# Patient Record
Sex: Female | Born: 1985 | Race: Black or African American | Hispanic: No | Marital: Single
Health system: Midwestern US, Community
[De-identification: ages and names within clinical notes are randomized; demographics above are authoritative.]

---
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
## 2016-05-10 ENCOUNTER — Inpatient Hospital Stay (HOSPITAL_COMMUNITY)
Admission: AD | Admit: 2016-05-10 | Discharge: 2016-05-10 | Disposition: A | Discharge status: Home / Self Care | Payer: Medicaid Other | Source: Ambulatory Visit | Attending: Obstetrics and Gynecology | Admitting: Obstetrics and Gynecology

## 2016-05-10 ENCOUNTER — Encounter (HOSPITAL_COMMUNITY): Payer: Self-pay | Admitting: *Deleted

## 2016-05-10 DIAGNOSIS — R1030 Lower abdominal pain, unspecified: Secondary | ICD-10-CM | POA: Insufficient documentation

## 2016-05-10 DIAGNOSIS — F17200 Nicotine dependence, unspecified, uncomplicated: Secondary | ICD-10-CM | POA: Insufficient documentation

## 2016-05-10 DIAGNOSIS — M545 Low back pain, unspecified: Secondary | ICD-10-CM

## 2016-05-10 DIAGNOSIS — M549 Dorsalgia, unspecified: Secondary | ICD-10-CM | POA: Diagnosis present

## 2016-05-10 LAB — URINALYSIS, ROUTINE W REFLEX MICROSCOPIC
Bilirubin Urine: NEGATIVE
Glucose, UA: NEGATIVE mg/dL
Ketones, ur: NEGATIVE mg/dL
Leukocytes, UA: NEGATIVE
Nitrite: NEGATIVE
PH: 6 (ref 5.0–8.0)
Protein, ur: NEGATIVE mg/dL
Specific Gravity, Urine: 1.025 (ref 1.005–1.030)

## 2016-05-10 LAB — WET PREP, GENITAL
Sperm: NONE SEEN
Trich, Wet Prep: NONE SEEN
WBC, Wet Prep HPF POC: NONE SEEN
Yeast Wet Prep HPF POC: NONE SEEN

## 2016-05-10 LAB — URINE MICROSCOPIC-ADD ON

## 2016-05-10 LAB — GC/CHLAMYDIA PROBE AMP (~~LOC~~) NOT AT ARMC
Chlamydia: NEGATIVE
NEISSERIA GONORRHEA: NEGATIVE

## 2016-05-10 LAB — POCT PREGNANCY, URINE: Preg Test, Ur: NEGATIVE

## 2016-05-10 MED ORDER — IBUPROFEN 800 MG PO TABS: 800.0000 mg | ORAL_TABLET | Freq: Three times a day (TID) | ORAL | 0 refills | Status: DC | PRN

## 2016-05-10 MED ORDER — IBUPROFEN 800 MG PO TABS
800.0000 mg | ORAL_TABLET | Freq: Once | ORAL | Status: AC
  Administered 2016-05-10: 800 mg via ORAL
  Filled 2016-05-10: qty 1

## 2016-05-10 NOTE — Discharge Instructions (Signed)
Contraception Choices Contraception (birth control) is the use of any methods or devices to prevent pregnancy. Below are some methods to help avoid pregnancy. HORMONAL METHODS   Contraceptive implant. This is a thin, plastic tube containing progesterone hormone. It does not contain estrogen hormone. Your health care provider inserts the tube in the inner part of the upper arm. The tube can remain in place for up to 3 years. After 3 years, the implant must be removed. The implant prevents the ovaries from releasing an egg (ovulation), thickens the cervical mucus to prevent sperm from entering the uterus, and thins the lining of the inside of the uterus.  Progesterone-only injections. These injections are given every 3 months by your health care provider to prevent pregnancy. This synthetic progesterone hormone stops the ovaries from releasing eggs. It also thickens cervical mucus and changes the uterine lining. This makes it harder for sperm to survive in the uterus.  Birth control pills. These pills contain estrogen and progesterone hormone. They work by preventing the ovaries from releasing eggs (ovulation). They also cause the cervical mucus to thicken, preventing the sperm from entering the uterus. Birth control pills are prescribed by a health care provider.Birth control pills can also be used to treat heavy periods.  Minipill. This type of birth control pill contains only the progesterone hormone. They are taken every day of each month and must be prescribed by your health care provider.  Birth control patch. The patch contains hormones similar to those in birth control pills. It must be changed once a week and is prescribed by a health care provider.  Vaginal ring. The ring contains hormones similar to those in birth control pills. It is left in the vagina for 3 weeks, removed for 1 week, and then a new one is put back in place. The patient must be comfortable inserting and removing the ring  from the vagina.A health care provider's prescription is necessary.  Emergency contraception. Emergency contraceptives prevent pregnancy after unprotected sexual intercourse. This pill can be taken right after sex or up to 5 days after unprotected sex. It is most effective the sooner you take the pills after having sexual intercourse. Most emergency contraceptive pills are available without a prescription. Check with your pharmacist. Do not use emergency contraception as your only form of birth control. BARRIER METHODS   Female condom. This is a thin sheath (latex or rubber) that is worn over the penis during sexual intercourse. It can be used with spermicide to increase effectiveness.  Female condom. This is a soft, loose-fitting sheath that is put into the vagina before sexual intercourse.  Diaphragm. This is a soft, latex, dome-shaped barrier that must be fitted by a health care provider. It is inserted into the vagina, along with a spermicidal jelly. It is inserted before intercourse. The diaphragm should be left in the vagina for 6 to 8 hours after intercourse.  Cervical cap. This is a round, soft, latex or plastic cup that fits over the cervix and must be fitted by a health care provider. The cap can be left in place for up to 48 hours after intercourse.  Sponge. This is a soft, circular piece of polyurethane foam. The sponge has spermicide in it. It is inserted into the vagina after wetting it and before sexual intercourse.  Spermicides. These are chemicals that kill or block sperm from entering the cervix and uterus. They come in the form of creams, jellies, suppositories, foam, or tablets. They do not require a  prescription. They are inserted into the vagina with an applicator before having sexual intercourse. The process must be repeated every time you have sexual intercourse. INTRAUTERINE CONTRACEPTION  Intrauterine device (IUD). This is a T-shaped device that is put in a woman's uterus  during a menstrual period to prevent pregnancy. There are 2 types:  Copper IUD. This type of IUD is wrapped in copper wire and is placed inside the uterus. Copper makes the uterus and fallopian tubes produce a fluid that kills sperm. It can stay in place for 10 years.  Hormone IUD. This type of IUD contains the hormone progestin (synthetic progesterone). The hormone thickens the cervical mucus and prevents sperm from entering the uterus, and it also thins the uterine lining to prevent implantation of a fertilized egg. The hormone can weaken or kill the sperm that get into the uterus. It can stay in place for 3-5 years, depending on which type of IUD is used. PERMANENT METHODS OF CONTRACEPTION  Female tubal ligation. This is when the woman's fallopian tubes are surgically sealed, tied, or blocked to prevent the egg from traveling to the uterus.  Hysteroscopic sterilization. This involves placing a small coil or insert into each fallopian tube. Your doctor uses a technique called hysteroscopy to do the procedure. The device causes scar tissue to form. This results in permanent blockage of the fallopian tubes, so the sperm cannot fertilize the egg. It takes about 3 months after the procedure for the tubes to become blocked. You must use another form of birth control for these 3 months.  Female sterilization. This is when the female has the tubes that carry sperm tied off (vasectomy).This blocks sperm from entering the vagina during sexual intercourse. After the procedure, the man can still ejaculate fluid (semen). NATURAL PLANNING METHODS  Natural family planning. This is not having sexual intercourse or using a barrier method (condom, diaphragm, cervical cap) on days the woman could become pregnant.  Calendar method. This is keeping track of the length of each menstrual cycle and identifying when you are fertile.  Ovulation method. This is avoiding sexual intercourse during ovulation.  Symptothermal  method. This is avoiding sexual intercourse during ovulation, using a thermometer and ovulation symptoms.  Post-ovulation method. This is timing sexual intercourse after you have ovulated. Regardless of which type or method of contraception you choose, it is important that you use condoms to protect against the transmission of sexually transmitted infections (STIs). Talk with your health care provider about which form of contraception is most appropriate for you.   This information is not intended to replace advice given to you by your health care provider. Make sure you discuss any questions you have with your health care provider.   Document Released: 07/01/2005 Document Revised: 07/06/2013 Document Reviewed: 12/24/2012 Elsevier Interactive Patient Education 2016 [employer]  Prenatal Care Providers [facility]    [facility]  [phone]     Phone: [phone]          [facility]                      [facility] [place]  @[place]     Phone: [phone]  Phone: [phone]         [facility]     Phone: [phone]  Phone: [phone]           [facility]  [facility] @ [place]                hone: [phone]  Phone: [phone]         [facility]                                             Phone: [phone]           [facility]                Phone: [phone]  [facility]   [address] [place])          Phone: [phone] [facility]   Phone: [phone]

## 2016-05-10 NOTE — MAU Provider Note (Signed)
  History     CSN: [id number]  Arrival date and time: 05/10/16 [id number]   First Provider Initiated Contact with Patient 05/10/16 [phone]      Chief Complaint  Patient presents with  . Back Pain  . Abdominal Pain   Back Pain  This is a new problem. The current episode started 1 to 4 weeks ago. The problem occurs intermittently. The problem is unchanged. The pain is present in the lumbar spine (on the right side only). The pain is at a severity of 10/10. Associated symptoms include abdominal pain. Pertinent negatives include no dysuria or fever. Treatments tried: cranberry pills  The treatment provided no relief.  Abdominal Pain  This is a new problem. The current episode started in the past 7 days. The onset quality is gradual. The problem occurs intermittently. The problem has been unchanged. The pain is located in the suprapubic region. The pain is at a severity of 5/10. The abdominal pain does not radiate. Associated symptoms include constipation (last BM in about 2 days ) and nausea. Pertinent negatives include no diarrhea, dysuria, fever, frequency or vomiting. Nothing aggravates the pain. The pain is relieved by nothing. She has tried nothing for the symptoms.    No past medical history on file.  No past surgical history on file.  No family history on file.  Social History  Substance Use Topics  . Smoking status: Current Every Day Smoker  . Smokeless tobacco: Never Used     Comment: black and mild once a day  . Alcohol use No    Allergies: Allergies not on file  No prescriptions prior to admission.    Review of Systems  Constitutional: Negative for chills and fever.  Gastrointestinal: Positive for abdominal pain, constipation (last BM in about 2 days ) and nausea. Negative for diarrhea and vomiting.  Genitourinary: Negative for dysuria, frequency and urgency.  Musculoskeletal: Positive for back pain.   Physical Exam   Blood pressure 110/64, pulse 61, temperature 97.9 F  (36.6 C), temperature source Oral, resp. rate 18, height 5\' 2"  (1.575 m), weight 179 lb 4 oz (81.3 kg), last menstrual period 04/23/2016.  Physical Exam  MAU Course  Procedures  MDM Patient has had ibuprofen. She reports some improvement in her pain.   Assessment and Plan   1. Lower abdominal pain   2. Acute left-sided low back pain without sciatica    DC home Comfort measures reviewed  RX: ibuprofen 800mg  q 8 hours #30  Return to MAU as needed FU with OB as planned  Follow-up Information    [facility] .   Contact information: [address] [place] [address] [phone]            Tawnya CrookHogan, Kenndra Morris Donovan 05/10/2016, 7:27 AM

## 2016-05-10 NOTE — MAU Note (Signed)
PT  SAYS    SHE FEELS  PAIN  ON LEFT   SIDE  OF  BACK- STARTED     2 MTHS  AGO- STILL FEELS  SAME-   NO MED -   TOOK   CRANBERRY  PILLS.       SAYS  UPPER ABD  STARTED  HURTING   1 MTH  AGO.      SAYS LOWER  ABD  STARTED HURTING   2 WEEKS  AGO.      HPT   NEG.       HAD     IUD    IN - BUT  HAD REMOVED  IN [place]- IN JAN.    - NO  OTHER  BC.     LAST SEX-     SEPT.      SAME   PARTNER - X1 YEAR.     NO BURNING WITH VOIDING- BUT DOES HAVE INCREASE FREQ.     NO VOMITING/ DIARRHEA   .

## 2016-05-10 NOTE — MAU Note (Signed)
Notified Alyssa ShellerHeather Hogan CNM upper and lower abdominal pain, negative UPT, urinary frequency UA sent.

## 2016-12-04 LAB — GLUCOSE, POCT (MANUAL RESULT ENTRY): POC GLUCOSE: 115 mg/dL — AB (ref 70–99)

## 2018-07-10 ENCOUNTER — Inpatient Hospital Stay (HOSPITAL_COMMUNITY)
Admission: AD | Admit: 2018-07-10 | Discharge: 2018-07-10 | Disposition: A | Discharge status: Home / Self Care | Payer: Medicaid Other | Attending: Obstetrics & Gynecology | Admitting: Obstetrics & Gynecology

## 2018-07-10 ENCOUNTER — Encounter (HOSPITAL_COMMUNITY): Payer: Self-pay | Admitting: *Deleted

## 2018-07-10 DIAGNOSIS — R109 Unspecified abdominal pain: Secondary | ICD-10-CM | POA: Diagnosis present

## 2018-07-10 DIAGNOSIS — K219 Gastro-esophageal reflux disease without esophagitis: Secondary | ICD-10-CM | POA: Insufficient documentation

## 2018-07-10 DIAGNOSIS — K529 Noninfective gastroenteritis and colitis, unspecified: Secondary | ICD-10-CM | POA: Insufficient documentation

## 2018-07-10 DIAGNOSIS — F17200 Nicotine dependence, unspecified, uncomplicated: Secondary | ICD-10-CM | POA: Insufficient documentation

## 2018-07-10 DIAGNOSIS — K5900 Constipation, unspecified: Secondary | ICD-10-CM

## 2018-07-10 DIAGNOSIS — Z3202 Encounter for pregnancy test, result negative: Secondary | ICD-10-CM | POA: Insufficient documentation

## 2018-07-10 LAB — COMPREHENSIVE METABOLIC PANEL
ALT: 20 U/L (ref 0–44)
AST: 20 U/L (ref 15–41)
Albumin: 3.8 g/dL (ref 3.5–5.0)
Alkaline Phosphatase: 34 U/L — ABNORMAL LOW (ref 38–126)
Anion gap: 7 (ref 5–15)
BUN: 8 mg/dL (ref 6–20)
CO2: 26 mmol/L (ref 22–32)
Calcium: 8.6 mg/dL — ABNORMAL LOW (ref 8.9–10.3)
Chloride: 105 mmol/L (ref 98–111)
Creatinine, Ser: 0.65 mg/dL (ref 0.44–1.00)
GFR calc Af Amer: >60 mL/min (ref >60)
GFR calc non Af Amer: >60 mL/min (ref >60)
Glucose, Bld: 87 mg/dL (ref 70–99)
Potassium: 3.8 mmol/L (ref 3.5–5.1)
Sodium: 138 mmol/L (ref 135–145)
Total Bilirubin: 0.6 mg/dL (ref 0.3–1.2)
Total Protein: 7.5 g/dL (ref 6.5–8.1)

## 2018-07-10 LAB — CBC
HCT: 39.4 % (ref 36.0–46.0)
Hemoglobin: 13.4 g/dL (ref 12.0–15.0)
MCH: 31.5 pg (ref 26.0–34.0)
MCHC: 34 g/dL (ref 30.0–36.0)
MCV: 92.5 fL (ref 80.0–100.0)
Platelets: 235 10*3/uL (ref 150–400)
RBC: 4.26 MIL/uL (ref 3.87–5.11)
RDW: 12.5 % (ref 11.5–15.5)
WBC: 6.4 10*3/uL (ref 4.0–10.5)
nRBC: 0 % (ref 0.0–0.2)

## 2018-07-10 LAB — URINALYSIS, ROUTINE W REFLEX MICROSCOPIC
Bilirubin Urine: NEGATIVE
Glucose, UA: NEGATIVE mg/dL
Ketones, ur: NEGATIVE mg/dL
Leukocytes, UA: NEGATIVE
Nitrite: NEGATIVE
Protein, ur: NEGATIVE mg/dL
Specific Gravity, Urine: 1.016 (ref 1.005–1.030)
pH: 6 (ref 5.0–8.0)

## 2018-07-10 LAB — POCT PREGNANCY, URINE: Preg Test, Ur: NEGATIVE

## 2018-07-10 LAB — LIPASE, BLOOD: Lipase: 26 U/L (ref 11–51)

## 2018-07-10 MED ORDER — PANTOPRAZOLE SODIUM 40 MG PO TBEC: 40.0000 mg | DELAYED_RELEASE_TABLET | Freq: Every day | ORAL | 0 refills | Status: AC

## 2018-07-10 NOTE — MAU Provider Note (Signed)
Chief Complaint: Abdominal Pain   First Provider Initiated Contact with Patient 07/10/18 0914      SUBJECTIVE HPI: Alyssa Shelton is a 32 y.o. G2P1011 at Unknown by LMP who presents to maternity admissions reporting pain in her mid abdomen whenever she has not eaten and feeling flutters in her LLQ and above her umbilicus. These symptoms started 1 week ago.  She reports the pain is severe when she is hungry but improves after eating.  There is no n/v or diarrhea. She reports bowel movements 1-2 times weekly which is usual for her. Last BM 1 week ago.   She denies any risks for STDs. Her symptoms are associated with some right flank pain that is mild and intermittent. She reports she started drinking cranberry juice and taking AZO cranberry 2 days ago and the discomfort worsened.  She denies risks for STDs.  There are no other symptoms. She has not tried any other treatments. She took a pregnancy test at home that was negative.     HPI  History reviewed. No pertinent past medical history. History reviewed. No pertinent surgical history. Social History   Socioeconomic History  . Marital status: Single    Spouse name: Not on file  . Number of children: Not on file  . Years of education: Not on file  . Highest education level: Not on file  Occupational History  . Not on file  Social Needs  . Financial resource strain: Not on file  . Food insecurity:    Worry: Not on file    Inability: Not on file  . Transportation needs:    Medical: Not on file    Non-medical: Not on file  Tobacco Use  . Smoking status: Current Every Day Smoker  . Smokeless tobacco: Never Used  . Tobacco comment: black and mild once a day  Substance and Sexual Activity  . Alcohol use: No  . Drug use: Yes    Types: Marijuana  . Sexual activity: Yes    Birth control/protection: None  Lifestyle  . Physical activity:    Days per week: Not on file    Minutes per session: Not on file  . Stress: Not on file   Relationships  . Social connections:    Talks on phone: Not on file    Gets together: Not on file    Attends religious service: Not on file    Active member of club or organization: Not on file    Attends meetings of clubs or organizations: Not on file    Relationship status: Not on file  . Intimate partner violence:    Fear of current or ex partner: Not on file    Emotionally abused: Not on file    Physically abused: Not on file    Forced sexual activity: Not on file  Other Topics Concern  . Not on file  Social History Narrative  . Not on file   No current facility-administered medications on file prior to encounter.    Current Outpatient Medications on File Prior to Encounter  Medication Sig Dispense Refill  . ibuprofen (ADVIL,MOTRIN) 800 MG tablet Take 1 tablet (800 mg total) by mouth every 8 (eight) hours as needed. 30 tablet 0   No Known Allergies  ROS:  Review of Systems  Constitutional: Negative for chills, fatigue and fever.  Respiratory: Negative for shortness of breath.   Cardiovascular: Negative for chest pain.  Gastrointestinal: Positive for abdominal pain and constipation. Negative for diarrhea, nausea and vomiting.  Genitourinary: Positive for flank pain. Negative for difficulty urinating, dysuria, pelvic pain, vaginal bleeding, vaginal discharge and vaginal pain.  Neurological: Negative for dizziness and headaches.  Psychiatric/Behavioral: Negative.      I have reviewed patient's Past Medical Hx, Surgical Hx, Family Hx, Social Hx, medications and allergies.   Physical Exam   Patient Vitals for the past 24 hrs:  BP Temp Temp src Pulse Resp SpO2 Height Weight  07/10/18 1108 97/69 - - 63 16 - - -  07/10/18 0858 140/77 98 F (36.7 C) Oral 73 16 100 % 5\' 3"  (1.6 m) 82.1 kg   Constitutional: Well-developed, well-nourished female in no acute distress.  Cardiovascular: normal rate Respiratory: normal effort GI: Abd soft, non-tender. Pos BS x 4 MS:  Extremities nontender, no edema, normal ROM Neurologic: Alert and oriented x 4.  GU: Neg CVAT.  PELVIC EXAM:Deferred   LAB RESULTS Results for orders placed or performed during the hospital encounter of 07/10/18 (from the past 24 hour(s))  Urinalysis, Routine w reflex microscopic     Status: Abnormal   Collection Time: 07/10/18  9:03 AM  Result Value Ref Range   Color, Urine YELLOW YELLOW   APPearance CLOUDY (A) CLEAR   Specific Gravity, Urine 1.016 1.005 - 1.030   pH 6.0 5.0 - 8.0   Glucose, UA NEGATIVE NEGATIVE mg/dL   Hgb urine dipstick SMALL (A) NEGATIVE   Bilirubin Urine NEGATIVE NEGATIVE   Ketones, ur NEGATIVE NEGATIVE mg/dL   Protein, ur NEGATIVE NEGATIVE mg/dL   Nitrite NEGATIVE NEGATIVE   Leukocytes, UA NEGATIVE NEGATIVE   RBC / HPF 6-10 0 - 5 RBC/hpf   WBC, UA 6-10 0 - 5 WBC/hpf   Bacteria, UA RARE (A) NONE SEEN   Squamous Epithelial / LPF 21-50 0 - 5   Mucus PRESENT   Pregnancy, urine POC     Status: None   Collection Time: 07/10/18  9:10 AM  Result Value Ref Range   Preg Test, Ur NEGATIVE NEGATIVE  CBC     Status: None   Collection Time: 07/10/18 10:04 AM  Result Value Ref Range   WBC 6.4 4.0 - 10.5 K/uL   RBC 4.26 3.87 - 5.11 MIL/uL   Hemoglobin 13.4 12.0 - 15.0 g/dL   HCT 16.139.4 09.636.0 - 04.546.0 %   MCV 92.5 80.0 - 100.0 fL   MCH 31.5 26.0 - 34.0 pg   MCHC 34.0 30.0 - 36.0 g/dL   RDW 40.912.5 81.111.5 - 91.415.5 %   Platelets 235 150 - 400 K/uL   nRBC 0.0 0.0 - 0.2 %  Comprehensive metabolic panel     Status: Abnormal   Collection Time: 07/10/18 10:04 AM  Result Value Ref Range   Sodium 138 135 - 145 mmol/L   Potassium 3.8 3.5 - 5.1 mmol/L   Chloride 105 98 - 111 mmol/L   CO2 26 22 - 32 mmol/L   Glucose, Bld 87 70 - 99 mg/dL   BUN 8 6 - 20 mg/dL   Creatinine, Ser 7.820.65 0.44 - 1.00 mg/dL   Calcium 8.6 (L) 8.9 - 10.3 mg/dL   Total Protein 7.5 6.5 - 8.1 g/dL   Albumin 3.8 3.5 - 5.0 g/dL   AST 20 15 - 41 U/L   ALT 20 0 - 44 U/L   Alkaline Phosphatase 34 (L) 38 - 126  U/L   Total Bilirubin 0.6 0.3 - 1.2 mg/dL   GFR calc non Af Amer >60 >60 mL/min   GFR calc Af Amer >60 >  60 mL/min   Anion gap 7 5 - 15  Lipase, blood     Status: None   Collection Time: 07/10/18 10:04 AM  Result Value Ref Range   Lipase 26 11 - 51 U/L       IMAGING No results found.  MAU Management/MDM: Ordered labs and reviewed results. CBC, CMP, lipase all wnl.  No acute abdomen on exam.  No CVA tenderness.  UA with some WBCs, and small Hgb, otherwise normal. Sent for culture.  Pain before eating may be gastroenteritis/GERD, symptoms also c/w ulcer or pre-ulcer.  Will treat with PPI daily x 2 weeks. Pt reports bowel movement weekly is her usual but she is drinking soda and not much water and not eating well.  Reviewed high fiber diet and increased water intake with pt who states understanding.  Pt to f/u with primary care.   Pt discharged with strict return precautions.  ASSESSMENT 1. Gastroenteritis   2. Constipation, unspecified constipation type   3. GERD without esophagitis     PLAN Discharge home Allergies as of 07/10/2018   No Known Allergies     Medication List    STOP taking these medications   ibuprofen 800 MG tablet Commonly known as:  ADVIL,MOTRIN     TAKE these medications   pantoprazole 40 MG tablet Commonly known as:  PROTONIX Take 1 tablet (40 mg total) by mouth daily for 14 days.      Follow-up Information    [facility] Follow up.   Why:  Or Primary Care Provider of your choice, see list provided. Contact information: [address] [place] [phone]       [facility].   Specialty:  Obstetrics and Gynecology Why:  Walk in options for Gyn care 4-8 pm Monday-Thursday or by appointment M-F 8am-5pm. Contact information: [address] [place] [id number] [phone]          Sharen CounterLisa Leftwich-Kirby Certified  Nurse-Midwife 07/10/2018  11:30 AM

## 2018-07-10 NOTE — Discharge Instructions (Signed)
[facility] Resource Guide (Revised August 2014)   No Primary Care Doctor:  To locate a primary care doctor that accepts your insurance or provides certain services:           [facility]: [phone]           Physician Referral Service: [phone] ask for My [facility]  If no insurance, you need to see if you qualify for [facility] orange card, call to set      up appointment for eligibility/enrollment at [phone] or [phone] or visit [facility] and [employer] ([address], [place] and [address]) to meet with a [facility] enrollment specialist.  Agencies that provide inexpensive (sliding fee scale) medical care:       [facility] - [facility] [place] - [phone]     [facility]  -  [facility] [phone]     [facility] - [phone]     [facility] - [phone]     [facility] [phone]- [phone]     [facility] [phone]- [phone]  [facility] - [facility] @ [place] [phone]- [phone]  [facility] - [facility] @ [place] - [phone]  [facility]: [phone]   [facility]: [phone]   [facility]: [phone]   [facility]- 336- [phone]    Medicaid-accepting [facility] Providers:           [facility] [phone] (No Family Planning accepted)          [address], [phone], Mon-Fri 9am-5pm          [facility] - [phone]  [address], [address] [place], Mon-Thursday 8am-5pm, Fri 8am-noon  [facility] - [phone]          [address], [address], Mon-Fri 7:30am-4:30pm          [facility] - [phone]          [address],  [place]Fri 8am-5pm          [facility] - [phone]          [address], [address]          Only accepts [place] [employer] Medicaid patients after they have their name applied to their card

## 2018-07-10 NOTE — MAU Note (Signed)
Pt reports mid abd and right side pain for 2 weeks, not hurting now but was hurting last pm.

## 2018-07-11 LAB — URINE CULTURE: Culture: >=100000 — AB

## 2018-07-31 ENCOUNTER — Ambulatory Visit (HOSPITAL_COMMUNITY)
Admission: EM | Admit: 2018-07-31 | Discharge: 2018-07-31 | Disposition: A | Discharge status: Home / Self Care | Payer: Medicaid Other | Attending: Internal Medicine | Admitting: Internal Medicine

## 2018-07-31 ENCOUNTER — Encounter (HOSPITAL_COMMUNITY): Payer: Self-pay | Admitting: Emergency Medicine

## 2018-07-31 ENCOUNTER — Ambulatory Visit (INDEPENDENT_AMBULATORY_CARE_PROVIDER_SITE_OTHER): Payer: Medicaid Other

## 2018-07-31 DIAGNOSIS — R1012 Left upper quadrant pain: Secondary | ICD-10-CM | POA: Diagnosis not present

## 2018-07-31 DIAGNOSIS — Z3202 Encounter for pregnancy test, result negative: Secondary | ICD-10-CM

## 2018-07-31 DIAGNOSIS — K5909 Other constipation: Secondary | ICD-10-CM | POA: Diagnosis not present

## 2018-07-31 DIAGNOSIS — K2900 Acute gastritis without bleeding: Secondary | ICD-10-CM | POA: Insufficient documentation

## 2018-07-31 DIAGNOSIS — R110 Nausea: Secondary | ICD-10-CM | POA: Diagnosis not present

## 2018-07-31 DIAGNOSIS — N898 Other specified noninflammatory disorders of vagina: Secondary | ICD-10-CM

## 2018-07-31 LAB — POCT URINALYSIS DIP (DEVICE)
Bilirubin Urine: NEGATIVE
GLUCOSE, UA: NEGATIVE mg/dL
Ketones, ur: NEGATIVE mg/dL
LEUKOCYTES UA: NEGATIVE
NITRITE: NEGATIVE
PROTEIN: NEGATIVE mg/dL
SPECIFIC GRAVITY, URINE: 1.015 (ref 1.005–1.030)
UROBILINOGEN UA: 1 mg/dL (ref 0.0–1.0)
pH: 6.5 (ref 5.0–8.0)

## 2018-07-31 LAB — POCT PREGNANCY, URINE: PREG TEST UR: NEGATIVE

## 2018-07-31 NOTE — ED Provider Notes (Signed)
[facility]    CSN: [id number] Arrival date & time: 07/31/18  0840     History   Chief Complaint Chief Complaint  Patient presents with  . Abdominal Pain    HPI Alyssa Shelton is a 33 y.o. female.   Who present with abdominal pain since end of December. She was seen  At [facility] and had a work labs and UA which were normal, and was diagnosed with GI and GERd and was prescribed pantoprazole but has not picked it up. Her reason for not picking it up, is she doubted the diagnosis she was given, since she did not have an abdominal exam.  LMP 2 days ago.  Epigastric pain and LUQ pain with almost like hunger pain, but pressure. Admits of history of constipation. Last BM > 2 days, but not 1 week. Some foods provoke heart burn. Greasy foods are hard to digest specially if she eats pork.      History reviewed. No pertinent past medical history.  There are no active problems to display for this patient.   History reviewed. No pertinent surgical history.  OB History    Gravida  2   Para  1   Term  1   Preterm      AB  1   Living  1     SAB      TAB  1   Ectopic      Multiple      Live Births  1            Home Medications    Prior to Admission medications   Medication Sig Start Date End Date Taking? Authorizing Provider  pantoprazole (PROTONIX) 40 MG tablet Take 1 tablet (40 mg total) by mouth daily for 14 days. 07/10/18 07/24/18  Leftwich-Kirby, Wilmer Floor, CNM    Family History No family history on file.  Social History Social History   Tobacco Use  . Smoking status: Current Every Day Smoker  . Smokeless tobacco: Never Used  . Tobacco comment: black and mild once a day  Substance Use Topics  . Alcohol use: No  . Drug use: Yes    Types: Marijuana     Allergies   Patient has no known allergies.   Review of Systems Review of Systems  Constitutional: Negative for chills, diaphoresis and fever.  Gastrointestinal: Positive for abdominal  pain, constipation and nausea. Negative for blood in stool, diarrhea and vomiting.       Denies belching. No melena  Endocrine: Negative for polyuria.  Genitourinary: Positive for vaginal discharge. Negative for difficulty urinating, dysuria and urgency.       Has light cream color discharge, with no odor or itching.   Skin: Negative for rash.     Physical Exam Triage Vital Signs ED Triage Vitals  Enc Vitals Group     BP 07/31/18 1020 (!) 110/57     Pulse Rate 07/31/18 1020 (!) 56     Resp 07/31/18 1020 16     Temp 07/31/18 1020 98.1 F (36.7 C)     Temp src --      SpO2 07/31/18 1020 100 %     Weight --      Height --      Head Circumference --      Peak Flow --      Pain Score 07/31/18 1025 0     Pain Loc --      Pain Edu? --  Excl. in GC? --    No data found.  Updated Vital Signs BP (!) 110/57   Pulse (!) 56   Temp 98.1 F (36.7 C)   Resp 16   LMP 07/29/2018 (Exact Date)   SpO2 100%   Visual Acuity Right Eye Distance:   Left Eye Distance:   Bilateral Distance:    Right Eye Near:   Left Eye Near:    Bilateral Near:     Physical Exam Vitals signs and nursing note reviewed.  Constitutional:      General: She is not in acute distress.    Appearance: She is well-developed. She is obese. She is not toxic-appearing.  HENT:     Head: Normocephalic.  Eyes:     General: No scleral icterus. Pulmonary:     Effort: Pulmonary effort is normal.  Abdominal:     General: Bowel sounds are normal. There is no distension.     Palpations: There is no hepatomegaly, splenomegaly or mass.     Tenderness: There is abdominal tenderness in the epigastric area and left upper quadrant. There is no guarding or rebound.     Comments: Has more discomfort on LUQ, and very mid on epigastric region  Skin:    General: Skin is warm and dry.     Findings: No rash.  Neurological:     Mental Status: She is alert and oriented to person, place, and time.  Psychiatric:        Mood  and Affect: Mood normal.        Behavior: Behavior normal.    UC Treatments / Results  Labs (all labs ordered are listed, but only abnormal results are displayed) Labs Reviewed  POCT URINALYSIS DIP (DEVICE) - Abnormal; Notable for the following components:      Result Value   Hgb urine dipstick SMALL (*)    All other components within normal limits  POCT PREGNANCY, URINE  UPT- neg  EKG None  Radiology Dg Abd 1 View  Result Date: 07/31/2018 CLINICAL DATA:  Upper abdominal pain LEFT greater than RIGHT, some nausea but no vomiting, history constipation, smoker EXAM: ABDOMEN - 1 VIEW COMPARISON:  None FINDINGS: Normal bowel gas pattern. Scattered normal stool burden. No bowel dilatation or bowel wall thickening. Osseous structures unremarkable. No urinary tract calcification. IMPRESSION: Normal exam. Electronically Signed   By: Ulyses SouthwardMark  Boles M.D.   On: 07/31/2018 12:14    Procedures Procedures (including critical care time)  Medications Ordered in UC Medications - No data to display  Initial Impression / Assessment and Plan / UC Course  I have reviewed the triage vital signs and the nursing notes. Pertinent labs & imaging results that were available during my care of the patient were reviewed by me and considered in my medical decision making (see chart for details). She may have some of the pressure on the LUQ from increased gas and some stool which I saw on her KUB before the final read came back. So I advised her to get citric mag and if epigastric discomfort does not  Improve, then needs to get on the PPI her GYN prescribed to her. Needs to Fu with her PCP if not better.   Final Clinical Impressions(s) / UC Diagnoses   Final diagnoses:  Other acute gastritis without hemorrhage  Other constipation     Discharge Instructions     Get Magnesium Citrate and drink it all to help clean up your colon, then drink 4-8 oz of prune juice  a day to prevent constipation, or take colace  100 mg twice day to prevent constipation.   If the upper abd pain is not gone after emptying your colon, then pick up the prescription for your stomach.     ED Prescriptions    None     Controlled Substance Prescriptions [place] Controlled Substance Registry consulted?    Garey HamRodriguez-Southworth, Rosabella Edgin, PA-C 07/31/18 1221

## 2018-07-31 NOTE — Discharge Instructions (Addendum)
Get Magnesium Citrate and drink it all to help clean up your colon, then drink 4-8 oz of prune juice a day to prevent constipation, or take colace 100 mg twice day to prevent constipation.   If the upper abd pain is not gone after emptying your colon, then pick up the prescription for your stomach.

## 2018-07-31 NOTE — ED Triage Notes (Signed)
Pt c/o generealized abdominal pain for several months, was seen at [facility] at [facility] end of December for the same thing, pt states shes unsure what they dx her with. Note from [facility] says GERD and gastroenteritis, they did labs and urine tests. Was prescribed pantoprazole and states she hasn't picked it up.

## 2019-12-25 IMAGING — DX DG ABDOMEN 1V
1 series · 1 of 1 positions shown · non-contrast
Comparison: None

CLINICAL DATA: Upper abdominal pain LEFT greater than RIGHT, some
nausea but no vomiting, history constipation, smoker

EXAM:
ABDOMEN - 1 VIEW

[abdomen kub]
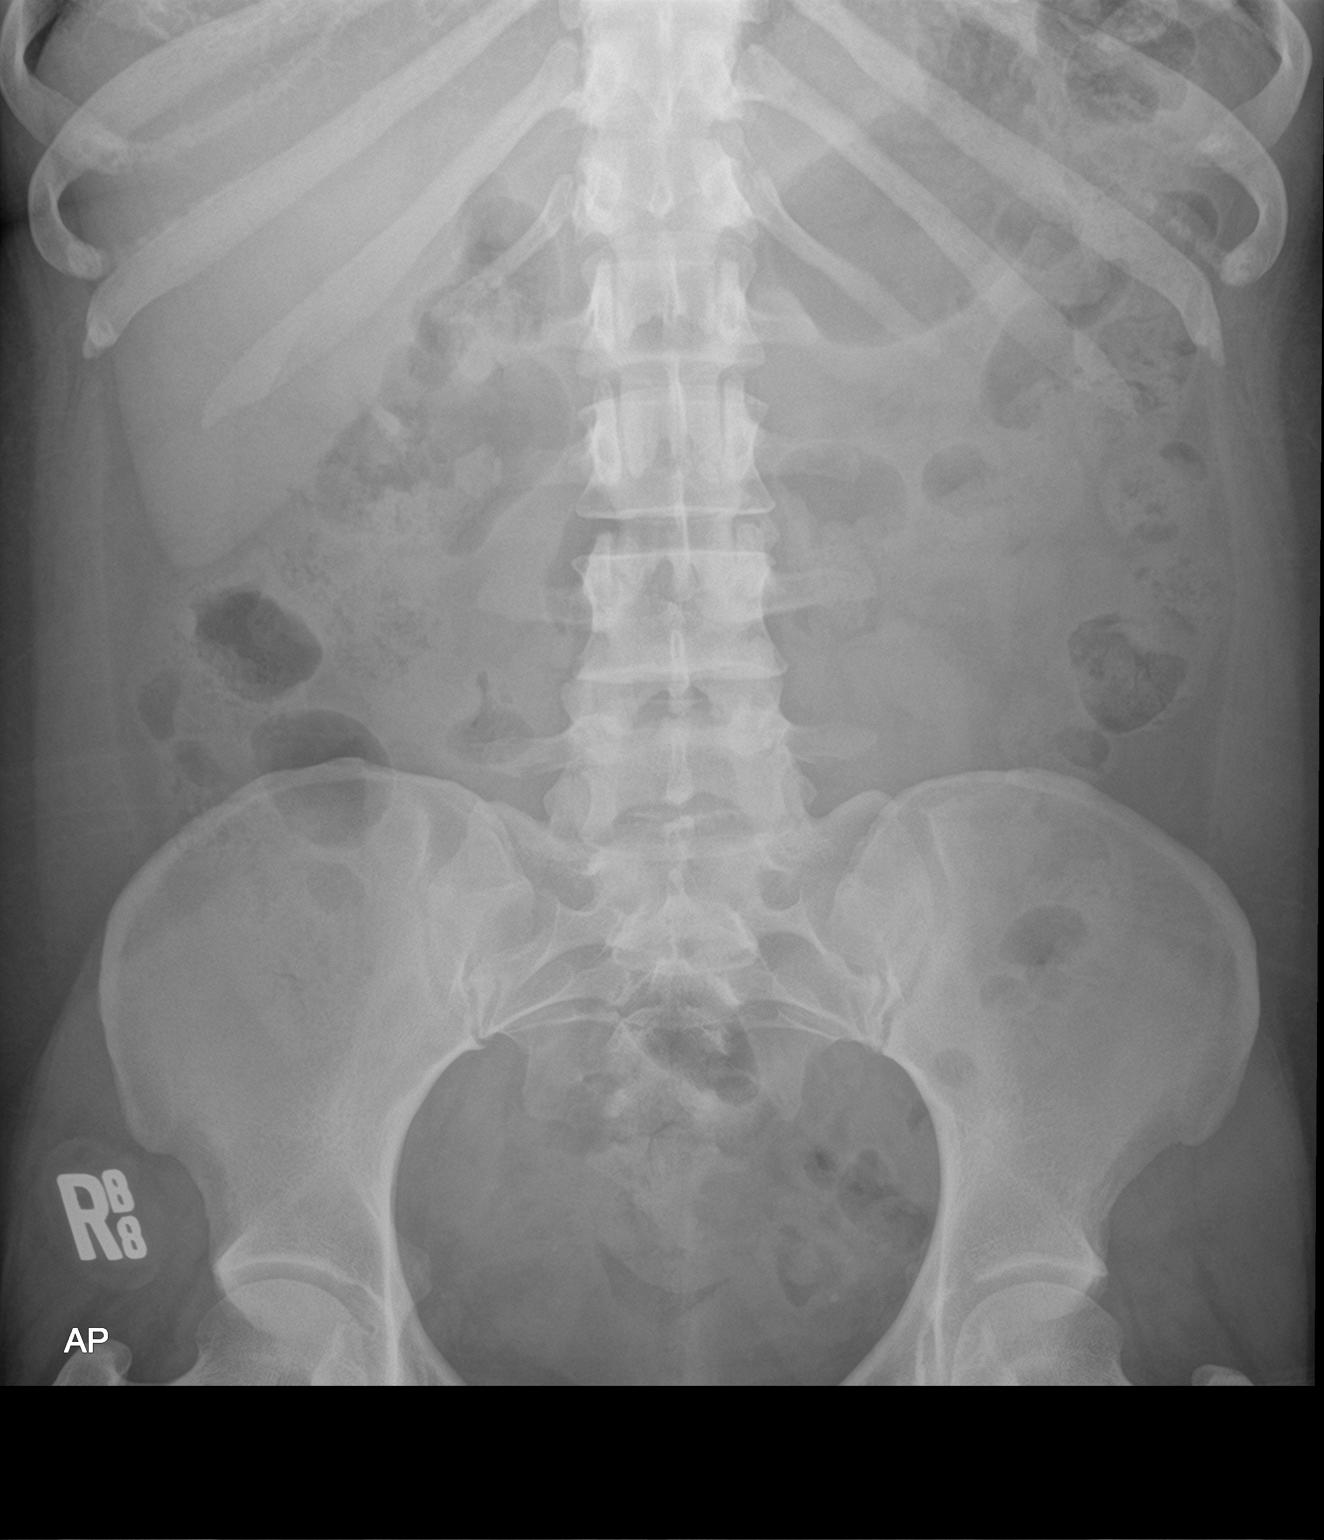

[1 of 1 positions shown; findings below may reference images not displayed]

FINDINGS: Normal bowel gas pattern.

Scattered normal stool burden.

No bowel dilatation or bowel wall thickening.

Osseous structures unremarkable.

No urinary tract calcification.
IMPRESSION: Normal exam.

## 2020-12-05 ENCOUNTER — Inpatient Hospital Stay
Admit: 2020-12-05 | Discharge: 2020-12-05 | Disposition: A | Discharge status: Home / Self Care | Payer: MEDICAID | Attending: Emergency Medicine

## 2020-12-05 DIAGNOSIS — O211 Hyperemesis gravidarum with metabolic disturbance: Secondary | ICD-10-CM

## 2020-12-05 LAB — CBC WITH AUTO DIFFERENTIAL
Absolute Eos #: 0.1 10*3/uL (ref 0.0–0.8)
Absolute Immature Granulocyte: 0.1 10*3/uL (ref 0.0–0.5)
Absolute Lymph #: 3.4 10*3/uL (ref 0.5–4.6)
Absolute Mono #: 0.6 10*3/uL (ref 0.1–1.3)
Basophils Absolute: 0.1 10*3/uL (ref 0.0–0.2)
Basophils: 1 % (ref 0.0–2.0)
Eosinophils %: 1 % (ref 0.5–7.8)
Hematocrit: 34.6 % — ABNORMAL LOW (ref 35.8–46.3)
Hemoglobin: 12.4 g/dL (ref 11.7–15.4)
Immature Granulocytes: 1 % (ref 0.0–5.0)
Lymphocytes: 38 % (ref 13–44)
MCH: 32 PG (ref 26.1–32.9)
MCHC: 35.8 g/dL — ABNORMAL HIGH (ref 31.4–35.0)
MCV: 89.2 FL (ref 79.6–97.8)
MPV: 9.8 FL (ref 9.4–12.3)
Monocytes: 7 % (ref 4.0–12.0)
Platelets: 237 10*3/uL (ref 150–450)
RBC: 3.88 M/uL — ABNORMAL LOW (ref 4.05–5.2)
RDW: 11.7 % — ABNORMAL LOW (ref 11.9–14.6)
Seg Neutrophils: 54 % (ref 43–78)
Segs Absolute: 4.9 10*3/uL (ref 1.7–8.2)
WBC: 9.1 10*3/uL (ref 4.3–11.1)
nRBC: 0 10*3/uL (ref 0.0–0.2)

## 2020-12-05 LAB — URINALYSIS
BACTERIA, URINE: 0 /hpf
Blood, Urine: NEGATIVE
Glucose, UA: NEGATIVE mg/dL
Ketones, Urine: >80 mg/dL — AB
Leukocyte Esterase, Urine: NEGATIVE
Nitrite, Urine: NEGATIVE
Specific Gravity, UA: 1.028 — ABNORMAL HIGH (ref 1.001–1.023)
Urobilinogen, Urine: 2 EU/dL — ABNORMAL HIGH (ref 0.2–1.0)
pH, Urine: 8 (ref 5.0–9.0)

## 2020-12-05 LAB — COMPREHENSIVE METABOLIC PANEL
ALT: 16 U/L (ref 12–65)
AST: 15 U/L (ref 15–37)
Albumin/Globulin Ratio: 0.9 — ABNORMAL LOW (ref 1.2–3.5)
Albumin: 3.4 g/dL — ABNORMAL LOW (ref 3.5–5.0)
Alk Phosphatase: 39 U/L — ABNORMAL LOW (ref 50–130)
Anion Gap: 9 mmol/L (ref 7–16)
BUN: 7 MG/DL (ref 6–23)
CO2: 22 mmol/L (ref 21–32)
Calcium: 9.2 MG/DL (ref 8.3–10.4)
Chloride: 108 mmol/L — ABNORMAL HIGH (ref 98–107)
Creatinine: 0.55 MG/DL — ABNORMAL LOW (ref 0.6–1.0)
GFR African American: >60 mL/min/{1.73_m2} (ref >60)
GFR Non-African American: >60 mL/min/{1.73_m2} (ref >60)
Globulin: 3.9 g/dL — ABNORMAL HIGH (ref 2.3–3.5)
Glucose: 84 mg/dL (ref 65–100)
Potassium: 3.4 mmol/L — ABNORMAL LOW (ref 3.5–5.1)
Sodium: 139 mmol/L (ref 136–145)
Total Bilirubin: 0.5 MG/DL (ref 0.2–1.1)
Total Protein: 7.3 g/dL (ref 6.3–8.2)

## 2020-12-05 LAB — POC PREGNANCY UR-QUAL: Preg Test, Ur: POSITIVE — AB

## 2020-12-05 MED ORDER — ONDANSETRON 8 MG PO TBDP
8 MG | ORAL_TABLET | Freq: Three times a day (TID) | ORAL | 0 refills | Status: AC | PRN
Start: 2020-12-05 — End: ?

## 2020-12-05 MED ORDER — SODIUM CHLORIDE 0.9 % IV SOLN
0.9 % | INTRAVENOUS | Status: DC
Start: 2020-12-05 — End: 2020-12-05
  Administered 2020-12-05: 09:00:00 via INTRAVENOUS

## 2020-12-05 MED ORDER — ONDANSETRON HCL 4 MG/2ML IJ SOLN
4 MG/2ML | INTRAMUSCULAR | Status: AC
Start: 2020-12-05 — End: 2020-12-05
  Administered 2020-12-05: 09:00:00 via INTRAVENOUS

## 2020-12-05 MED ORDER — POTASSIUM CHLORIDE CRYS ER 20 MEQ PO TBCR
20 MEQ | Freq: Two times a day (BID) | ORAL | Status: DC
Start: 2020-12-05 — End: 2020-12-05
  Administered 2020-12-05: 10:00:00 via ORAL

## 2020-12-05 MED FILL — ONDANSETRON HCL 4 MG/2ML IJ SOLN: 4 MG/2ML | INTRAMUSCULAR | Qty: 2

## 2020-12-05 MED FILL — POTASSIUM CHLORIDE CRYS ER 20 MEQ PO TBCR: 20 meq | ORAL | Qty: 2

## 2020-12-05 NOTE — ED Notes (Signed)
I have reviewed discharge instructions with the patient.  The patient verbalized understanding.    Patient left ED via Discharge Method: ambulatory to Home with family.    Opportunity for questions and clarification provided.       Patient given 1 scripts.         To continue your aftercare when you leave the hospital, you may receive an automated call from our care team to check in on how you are doing.  This is a free service and part of our promise to provide the best care and service to meet your aftercare needs.??? If you have questions, or wish to unsubscribe from this service please call [phone].  Thank you for Choosing our [facility].          Monia Pouch, RN  12/05/20 [phone]

## 2020-12-05 NOTE — ED Provider Notes (Signed)
[facility] Emergency Department Provider Note                   PCP:                None Provider               Age: 35 y.o.      Sex: female       ICD-10-CM    1. Hyperemesis gravidarum before end of [redacted] week gestation with electrolyte imbalance  O21.1    2. Hypokalemia  E87.6    3. Dehydration  E86.0        DISPOSITION Decision To Discharge 12/05/2020 06:27:53 AM       New Prescriptions    ONDANSETRON (ZOFRAN ODT) 8 MG TBDP DISINTEGRATING TABLET    Place 1 tablet under the tongue every 8 hours as needed for Nausea or Vomiting       Orders Placed This Encounter   Procedures   ??? CBC with Auto Differential   ??? Comprehensive Metabolic Panel   ??? Urinalysis   ??? POC Pregnancy Urine Qual   ??? POC Pregnancy Urine Qual        MDM  Number of Diagnoses or Management Options  Dehydration  Hyperemesis gravidarum before end of [redacted] week gestation with electrolyte imbalance  Hypokalemia  Diagnosis management comments: 35 year old G2, P1 approximately [redacted] weeks pregnant presented emergency department with nausea and vomiting.  Patient is feeling much better after fluids and Zofran.  She was mildly hypokalemic.  Patient tolerating p.o. fluids prior to discharge.  I did do a limited bedside ultrasound as patient has not had an ultrasound yet and patient did have an IUP.  Patient is scheduled to follow-up with OB already.  She had no vaginal bleeding or vaginal discharge.  She will turn Emergency Department for any concerns.       Amount and/or Complexity of Data Reviewed  Clinical lab tests: ordered and reviewed    Patient Progress  Patient progress: improved       Monica Casey is a 35 y.o. female who presents to the Emergency Department with chief complaint of    Chief Complaint   Patient presents with   ??? Hematemesis   ??? Abdominal Pain      35 year old African-American female that is a G2 P1 that is approximately [redacted] weeks pregnant (last menstrual period 09/26/2020) presented to the emergency department with complaints of  significant vomiting that has increased over the past 2 to 3 days.  Patient states that she has been intermittently vomiting since she found out she was pregnant [redacted] weeks ago.  She states that she has been vomiting so vigorously that sometimes she has blood in her emesis.  She states that it occurred again tonight.  Patient has a scheduled appointment with her OB/GYN next week but has not had a formal ultrasound.  Patient is not from here in [place] and has an appointment with her OB/GYN in [place].  Patient having states that when she was vomiting tonight she had some cramping in her lower abdomen but that subsequently stopped when she stopped vomiting.  She denies any fevers, chills, chest pain, shortness of breath, vaginal bleeding, vaginal discharge or any other concerns.            All other systems reviewed and are negative.    Review of Systems   Constitutional: Positive for appetite change and fatigue.   HENT: Negative.    Respiratory: Negative.  Cardiovascular: Negative.    Gastrointestinal: Positive for abdominal pain, nausea and vomiting.   Genitourinary: Negative.    Musculoskeletal: Negative.    Skin: Negative.    Neurological: Negative.    Psychiatric/Behavioral: Negative.    All other systems reviewed and are negative.      History reviewed. No pertinent past medical history.     History reviewed. No pertinent surgical history.     History reviewed. No pertinent family history.        Social Connections:    ??? Frequency of Communication with Friends and Family: Not on file   ??? Frequency of Social Gatherings with Friends and Family: Not on file   ??? Attends Religious Services: Not on file   ??? Active Member of Clubs or Organizations: Not on file   ??? Attends Archivist Meetings: Not on file   ??? Marital Status: Not on file        No Known Allergies     Vitals signs and nursing note reviewed.   Patient Vitals for the past 4 hrs:   Temp Pulse Resp BP SpO2   12/05/20 0500 -- 68 16 (!) 110/52 --    12/05/20 0338 97.7 ??F (36.5 ??C) 67 20 (!) 107/56 100 %          Physical Exam  Vitals and nursing note reviewed.   Constitutional:       General: She is not in acute distress.     Appearance: She is well-developed. She is not ill-appearing or toxic-appearing.   HENT:      Head: Normocephalic and atraumatic.      Mouth/Throat:      Comments: Mildly dry mucous membranes  Eyes:      Extraocular Movements: Extraocular movements intact.      Pupils: Pupils are equal, round, and reactive to light.   Cardiovascular:      Rate and Rhythm: Normal rate and regular rhythm.      Heart sounds: Normal heart sounds.   Pulmonary:      Effort: Pulmonary effort is normal.      Breath sounds: Normal breath sounds.   Abdominal:      General: Bowel sounds are normal.      Palpations: Abdomen is soft.      Tenderness: There is no abdominal tenderness.   Skin:     General: Skin is warm and dry.   Neurological:      General: No focal deficit present.      Mental Status: She is alert and oriented to person, place, and time.   Psychiatric:         Mood and Affect: Mood normal.         Behavior: Behavior normal.              Labs Reviewed   CBC WITH AUTO DIFFERENTIAL - Abnormal; Notable for the following components:       Result Value    RBC 3.88 (*)     Hematocrit 34.6 (*)     MCHC 35.8 (*)     RDW 11.7 (*)     All other components within normal limits   COMPREHENSIVE METABOLIC PANEL - Abnormal; Notable for the following components:    Potassium 3.4 (*)     Chloride 108 (*)     CREATININE 0.55 (*)     Alk Phosphatase 39 (*)     Albumin 3.4 (*)     Globulin 3.9 (*)  Albumin/Globulin Ratio 0.9 (*)     All other components within normal limits   URINALYSIS - Abnormal; Notable for the following components:    Specific Gravity, UA 1.028 (*)     Protein, UA TRACE (*)     Ketones, Urine >80 (*)     Bilirubin Urine SMALL (*)     Urobilinogen, Urine 2.0 (*)     All other components within normal limits   POC PREGNANCY UR-QUAL - Abnormal; Notable  for the following components:    Preg Test, Ur Positive (*)     All other components within normal limits   POC PREGNANCY UR-QUAL        No orders to display            Glasgow Coma Scale  Eye Opening: Spontaneous  Best Verbal Response: Oriented  Best Motor Response: Obeys commands  Glasgow Coma Scale Score: 15               ED Course as of 12/05/20 0637   Tue Dec 05, 2020   0605 Limited bedside ultrasound showed IUP with fetal heart rate around 150. [JL]   [id number] Patient is feeling much better.  Tolerating p.o. fluids.  Patient will be discharged home. [JL]      ED Course User Index  [JL] Kayren Eaves, MD        Voice dictation software was used during the making of this note.  This software is not perfect and grammatical and other typographical errors may be present.  This note has not been completely proofread for errors.       Kayren Eaves, MD  12/05/20 [phone]

## 2020-12-05 NOTE — Discharge Instructions (Signed)
Follow-up with your [facility] doctor as scheduled.  Aggressively hydrate.  Return the emergency department for any concerns.

## 2020-12-05 NOTE — ED Triage Notes (Signed)
Pt states that she is having nausea and vomiting and vomiting blood.  Also, is approx [redacted] weeks pregnant and having left lower abd pain.  Masked on arrival
